# Patient Record
Sex: Male | Born: 1990 | Race: White | Hispanic: No | Marital: Single | Smoking: Current every day smoker
Health system: Southern US, Community
[De-identification: ages and names within clinical notes are randomized; demographics above are authoritative.]

---
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
## 2012-02-20 ENCOUNTER — Emergency Department: Payer: Self-pay | Admitting: Emergency Medicine

## 2012-10-03 ENCOUNTER — Emergency Department: Payer: Self-pay | Admitting: Emergency Medicine

## 2013-06-01 IMAGING — CT CT HEAD WITHOUT CONTRAST
2 series · 16 of 30 positions shown, 20 images · non-contrast
Comparison: none

REASON FOR EXAM: syncope with forehead trauma
COMMENTS:

PROCEDURE:     CT  - CT HEAD WITHOUT CONTRAST  - February 21, 2012  [DATE]
RESULT:     History: Syncope. Trauma.
Comparison Study: No recent.

[Series 2: without · axial · non-contrast · 0.41mm/px · z∈[-200,-80]mm · 13 of 30 slices shown, 17 images]
[im 3/30  brain]
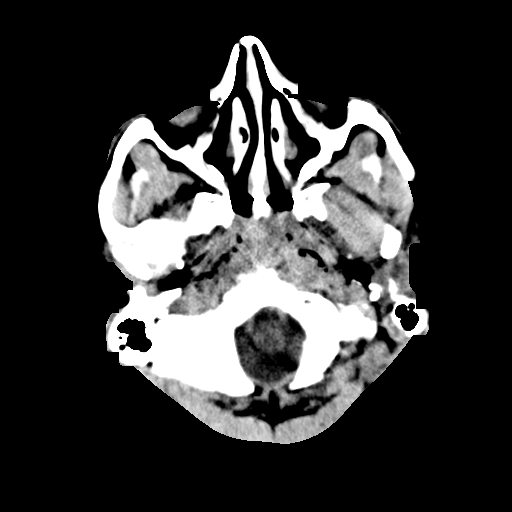
[im 3/30  bone]
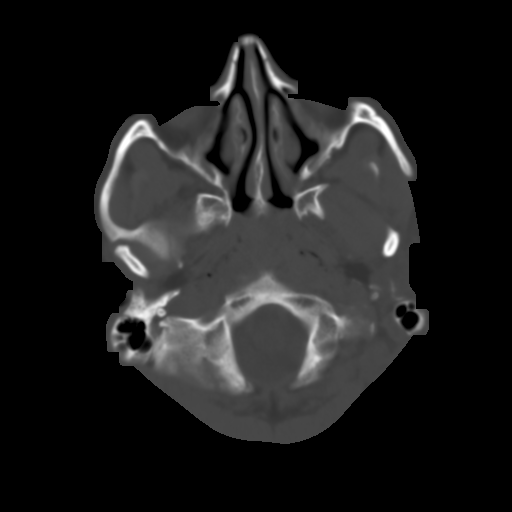
[im 5/30  brain]
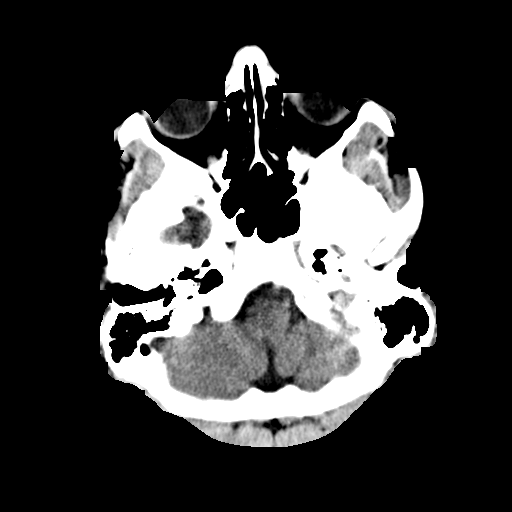
[im 7/30  brain]
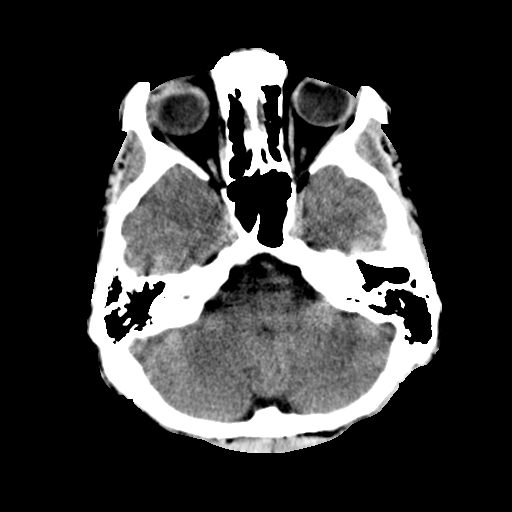
[im 9/30  brain]
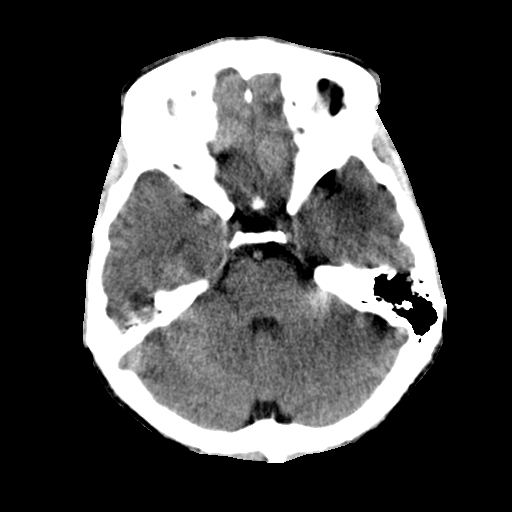
[im 11/30  brain]
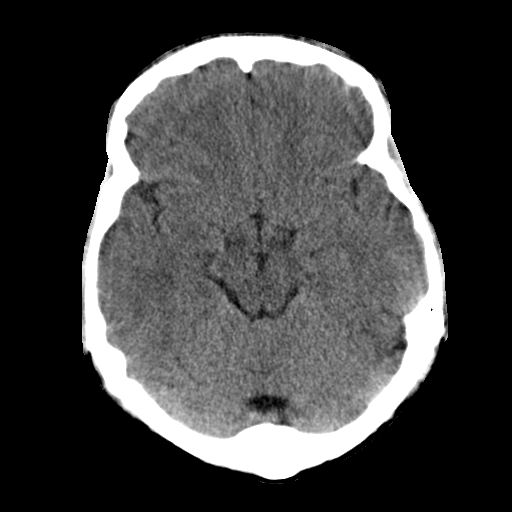
[im 11/30  bone]
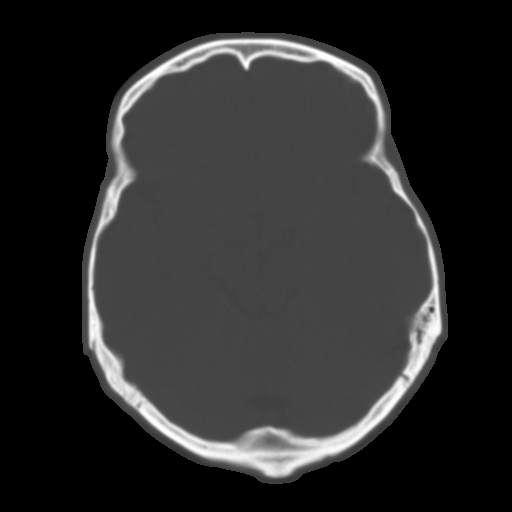
[im 13/30  brain]
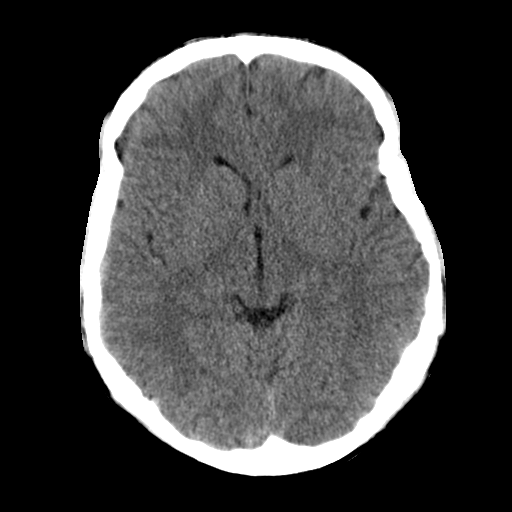
[im 15/30  brain]
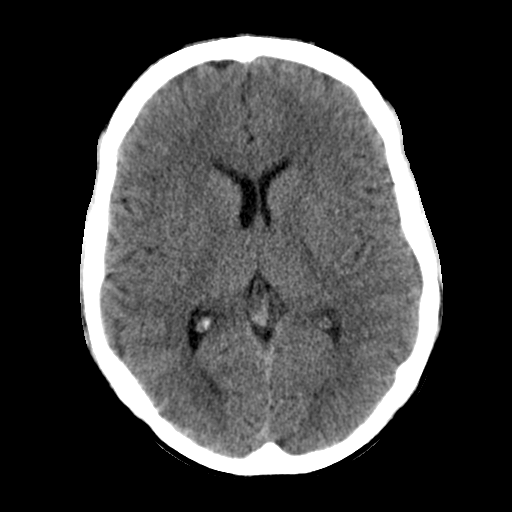
[im 17/30  brain]
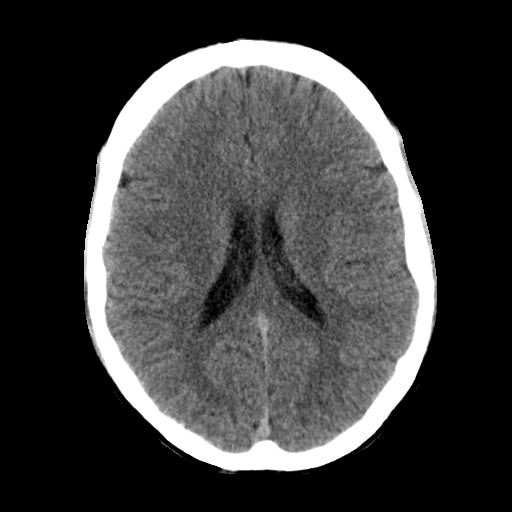
[im 19/30  brain]
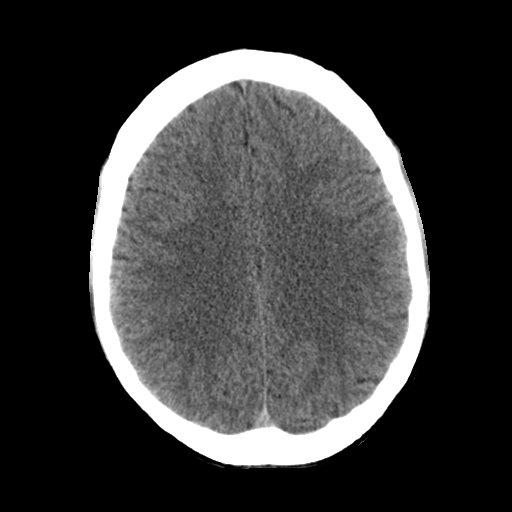
[im 19/30  bone]
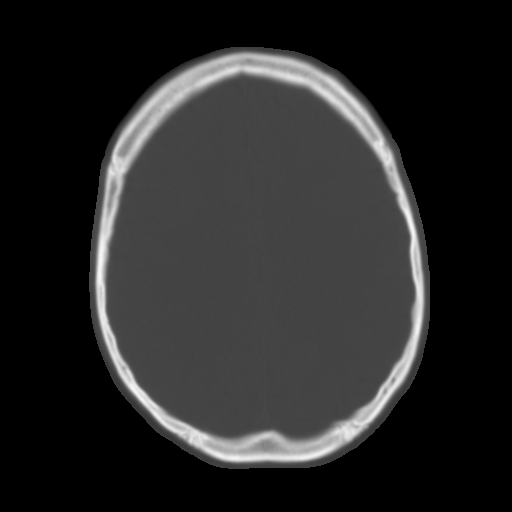
[im 21/30  brain]
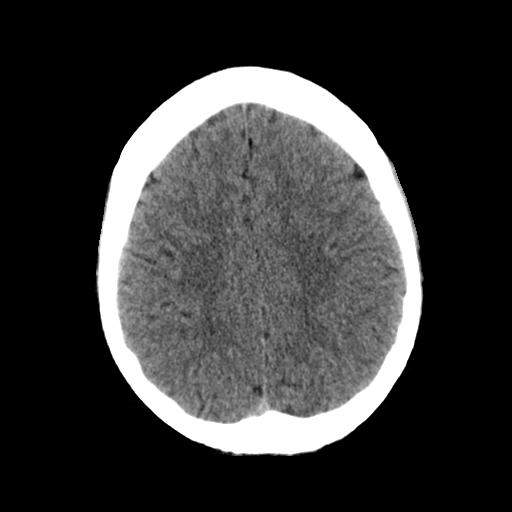
[im 23/30  brain]
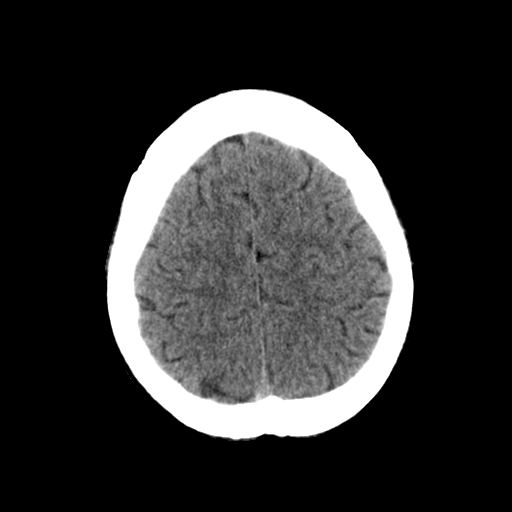
[im 25/30  brain]
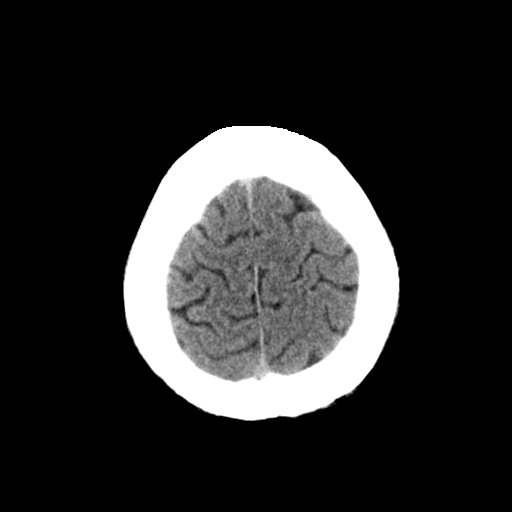
[im 27/30  brain]
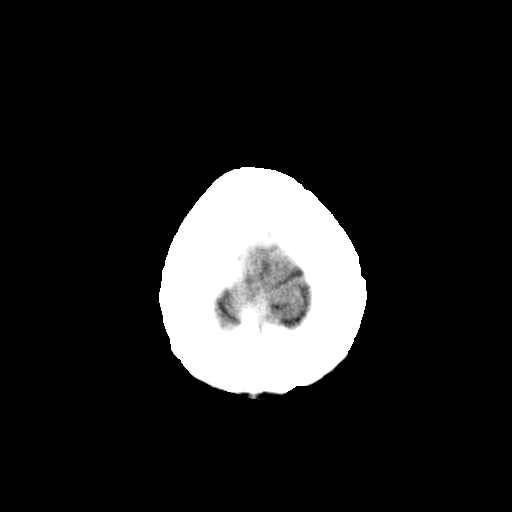
[im 27/30  bone]
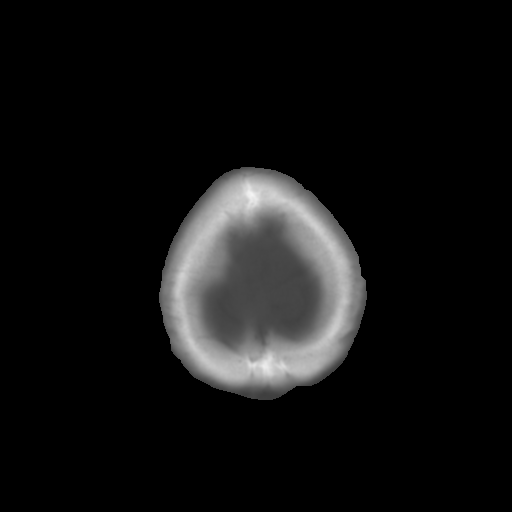

[Series 3: bone · axial · 0.41mm/px · z∈[-200,-160]mm · 3 of 30 slices shown]
[im 3/30  bone]
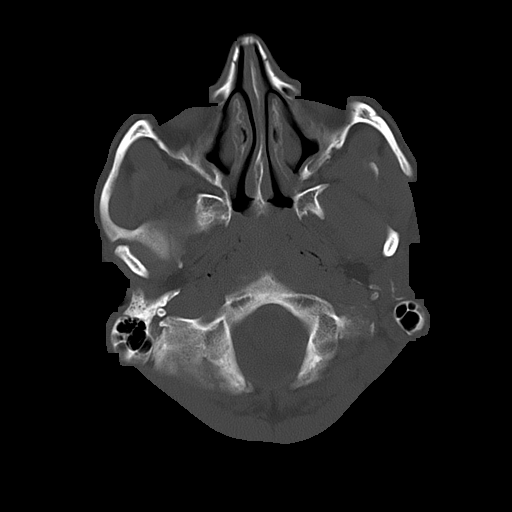
[im 7/30  bone]
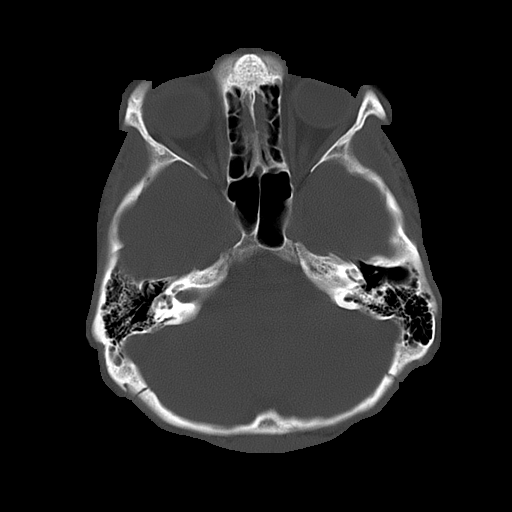
[im 11/30  bone]
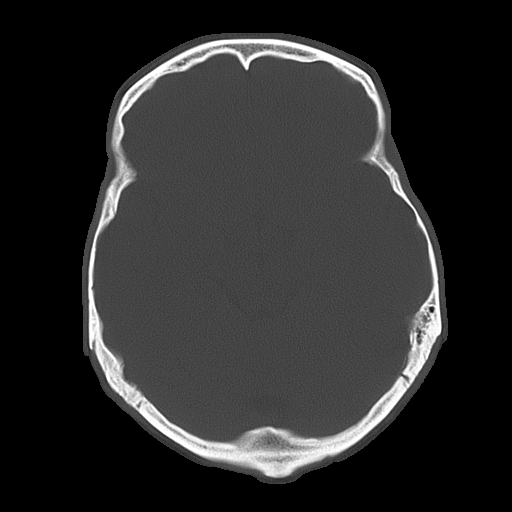

[16 of 30 positions shown; findings below may reference images not displayed]

FINDINGS: No mass. No hydrocephalus. No hemorrhage. No acute bony
abnormality. Mucosal thickening noted of the maxillary sinuses.
IMPRESSION: Mucosal thickening maxillary sinuses. No bony abnormality
demonstrated. No acute intracranial abnormality.

## 2014-01-12 IMAGING — CR DG CHEST 2V
1 series · 3 of 3 positions shown · non-contrast
Comparison: none

REASON FOR EXAM: cp worse with expiraton
COMMENTS:

PROCEDURE:     DXR - DXR CHEST PA (OR AP) AND LATERAL  - October 03, 2012 [DATE]
RESULT:     Comparison: None.

[Series 1: w chest pa · 0.14mm/px · 3 of 3 slices shown]
[im 1/3]
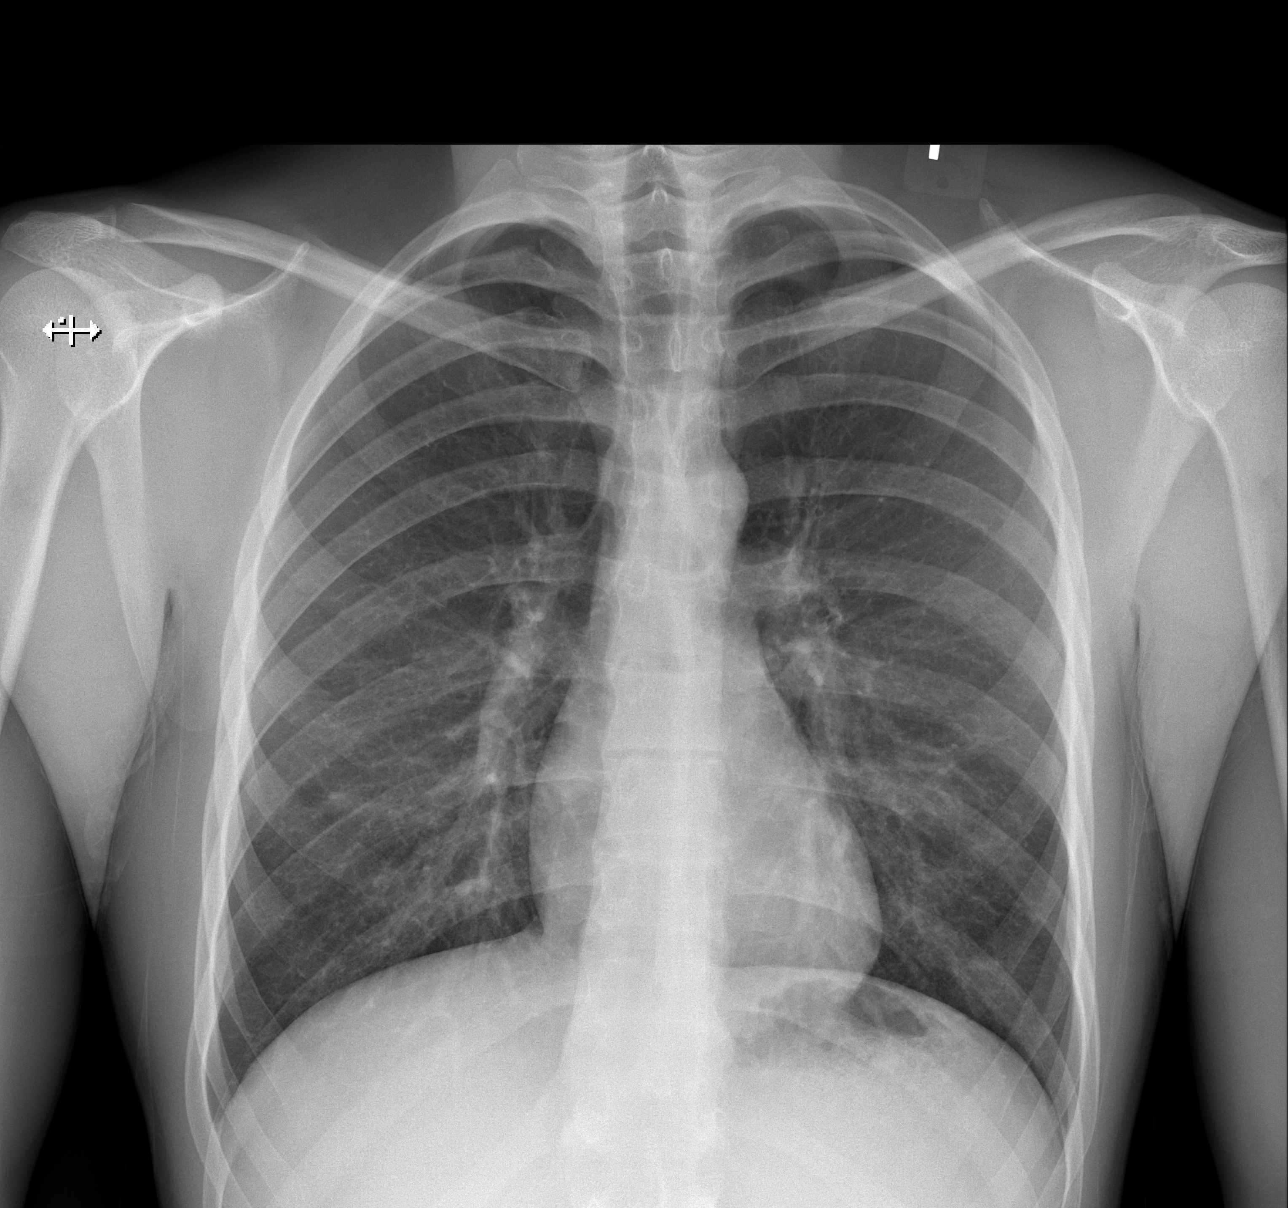
[im 2/3]
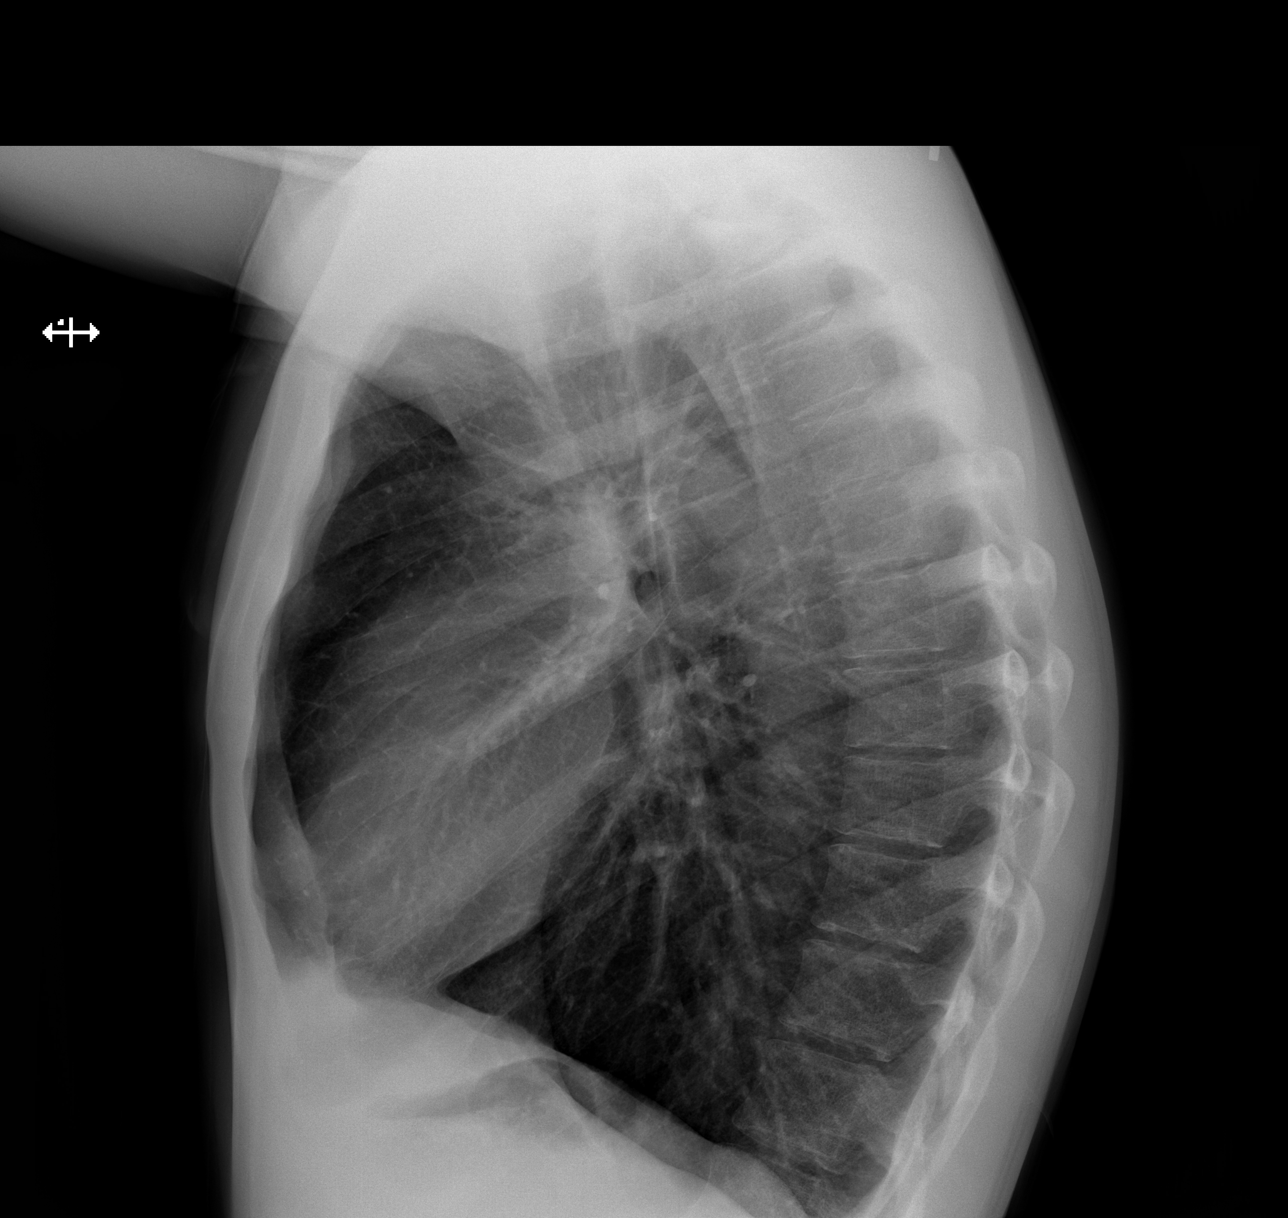
[im 3/3]
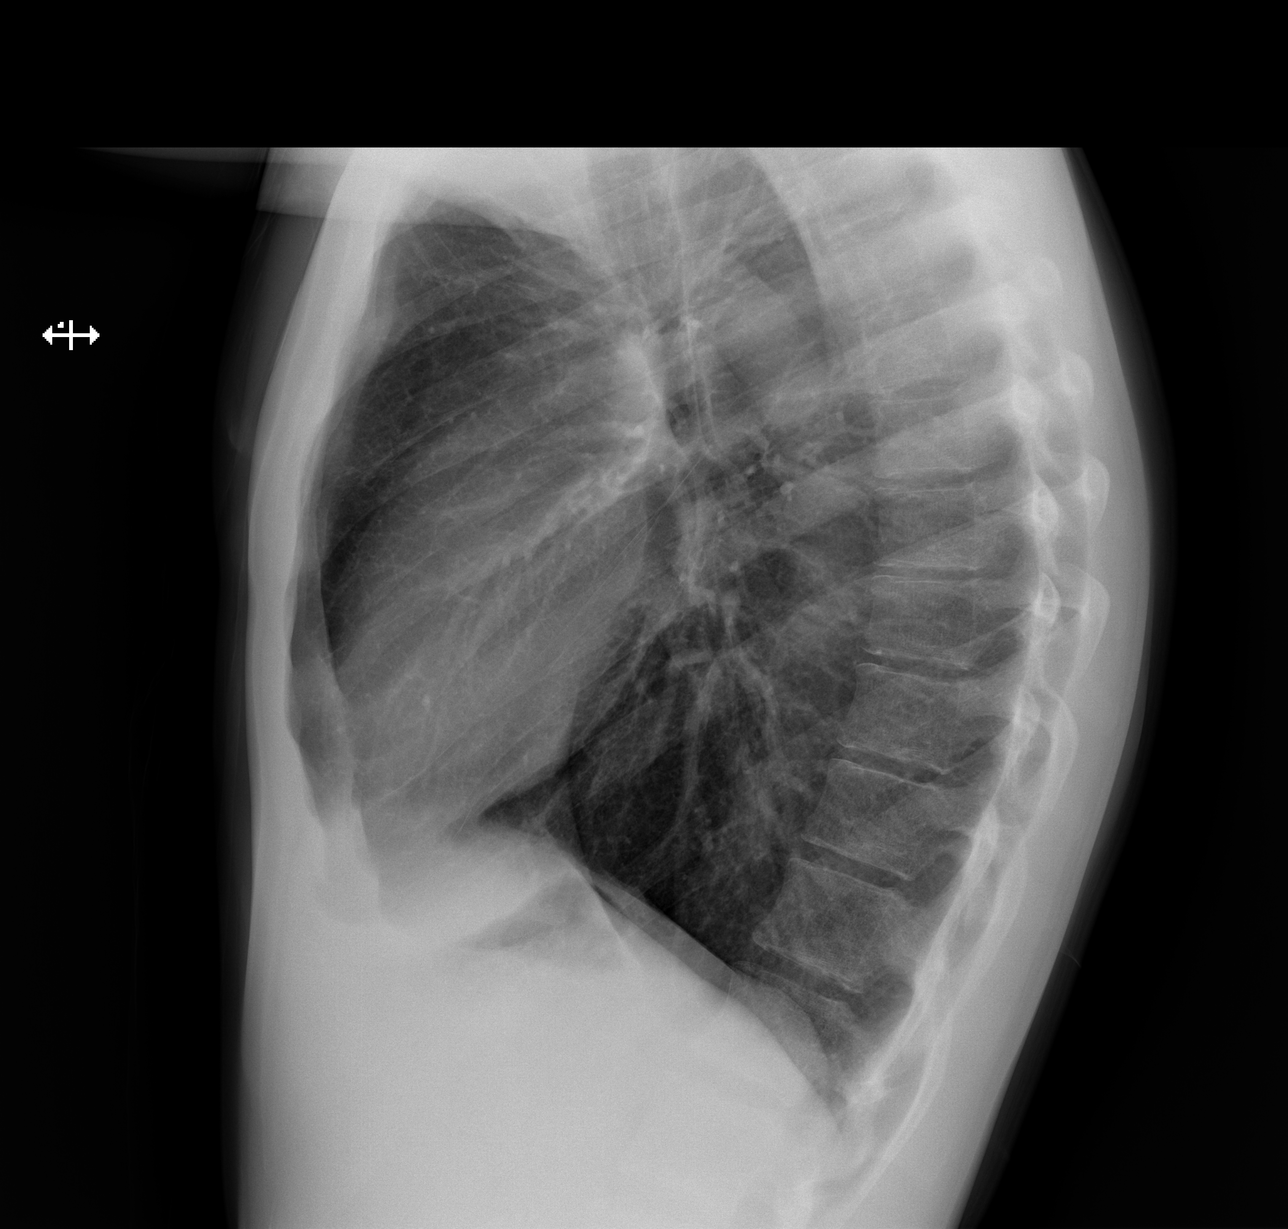

[3 of 3 positions shown; findings below may reference images not displayed]

FINDINGS: The heart and mediastinum are within normal limits. No focal pulmonary
opacities. The lungs are hyperinflated.
IMPRESSION: Hyperinflation. Otherwise, no acute cardiopulmonary disease.

[REDACTED]

## 2020-09-07 ENCOUNTER — Ambulatory Visit
Admission: EM | Admit: 2020-09-07 | Discharge: 2020-09-07 | Disposition: A | Discharge status: Home / Self Care | Payer: BC Managed Care – PPO | Attending: Sports Medicine | Admitting: Sports Medicine

## 2020-09-07 ENCOUNTER — Ambulatory Visit (INDEPENDENT_AMBULATORY_CARE_PROVIDER_SITE_OTHER): Payer: BC Managed Care – PPO

## 2020-09-07 ENCOUNTER — Other Ambulatory Visit: Payer: Self-pay

## 2020-09-07 DIAGNOSIS — R0989 Other specified symptoms and signs involving the circulatory and respiratory systems: Secondary | ICD-10-CM | POA: Diagnosis not present

## 2020-09-07 DIAGNOSIS — R0781 Pleurodynia: Secondary | ICD-10-CM

## 2020-09-07 DIAGNOSIS — R0789 Other chest pain: Secondary | ICD-10-CM | POA: Diagnosis not present

## 2020-09-07 DIAGNOSIS — U071 COVID-19: Secondary | ICD-10-CM | POA: Insufficient documentation

## 2020-09-07 DIAGNOSIS — I301 Infective pericarditis: Secondary | ICD-10-CM | POA: Diagnosis present

## 2020-09-07 LAB — CBC WITH DIFFERENTIAL/PLATELET
Abs Immature Granulocytes: 0.01 10*3/uL (ref 0.00–0.07)
Basophils Absolute: 0.1 10*3/uL (ref 0.0–0.1)
Basophils Relative: 1 %
Eosinophils Absolute: 0.1 10*3/uL (ref 0.0–0.5)
Eosinophils Relative: 2 %
HCT: 51.3 % (ref 39.0–52.0)
Hemoglobin: 17.4 g/dL — ABNORMAL HIGH (ref 13.0–17.0)
Immature Granulocytes: 0 %
Lymphocytes Relative: 36 %
Lymphs Abs: 2.3 10*3/uL (ref 0.7–4.0)
MCH: 29.8 pg (ref 26.0–34.0)
MCHC: 33.9 g/dL (ref 30.0–36.0)
MCV: 88 fL (ref 80.0–100.0)
Monocytes Absolute: 0.8 10*3/uL (ref 0.1–1.0)
Monocytes Relative: 12 %
Neutro Abs: 3.1 10*3/uL (ref 1.7–7.7)
Neutrophils Relative %: 49 %
Platelets: 363 10*3/uL (ref 150–400)
RBC: 5.83 MIL/uL — ABNORMAL HIGH (ref 4.22–5.81)
RDW: 12.8 % (ref 11.5–15.5)
WBC: 6.4 10*3/uL (ref 4.0–10.5)
nRBC: 0 % (ref 0.0–0.2)

## 2020-09-07 LAB — URINALYSIS, COMPLETE (UACMP) WITH MICROSCOPIC
Bacteria, UA: NONE SEEN
Bilirubin Urine: NEGATIVE
Glucose, UA: NEGATIVE mg/dL
Hgb urine dipstick: NEGATIVE
Ketones, ur: NEGATIVE mg/dL
Leukocytes,Ua: NEGATIVE
Nitrite: NEGATIVE
Protein, ur: NEGATIVE mg/dL
RBC / HPF: NONE SEEN RBC/hpf (ref 0–5)
Specific Gravity, Urine: 1.015 (ref 1.005–1.030)
pH: 6 (ref 5.0–8.0)

## 2020-09-07 LAB — SARS CORONAVIRUS 2 (TAT 6-24 HRS): SARS Coronavirus 2: POSITIVE — AB

## 2020-09-07 LAB — COMPREHENSIVE METABOLIC PANEL
ALT: 79 U/L — ABNORMAL HIGH (ref 0–44)
AST: 48 U/L — ABNORMAL HIGH (ref 15–41)
Albumin: 4.8 g/dL (ref 3.5–5.0)
Alkaline Phosphatase: 80 U/L (ref 38–126)
Anion gap: 4 — ABNORMAL LOW (ref 5–15)
BUN: 15 mg/dL (ref 6–20)
CO2: 33 mmol/L — ABNORMAL HIGH (ref 22–32)
Calcium: 9.7 mg/dL (ref 8.9–10.3)
Chloride: 101 mmol/L (ref 98–111)
Creatinine, Ser: 0.93 mg/dL (ref 0.61–1.24)
GFR, Estimated: >60 mL/min (ref >60)
Glucose, Bld: 96 mg/dL (ref 70–99)
Potassium: 4.6 mmol/L (ref 3.5–5.1)
Sodium: 138 mmol/L (ref 135–145)
Total Bilirubin: 0.8 mg/dL (ref 0.3–1.2)
Total Protein: 8.5 g/dL — ABNORMAL HIGH (ref 6.5–8.1)

## 2020-09-07 LAB — TROPONIN I (HIGH SENSITIVITY): Troponin I (High Sensitivity): 3 ng/L (ref <18)

## 2020-09-07 MED ORDER — IBUPROFEN 600 MG PO TABS: 600.0000 mg | ORAL_TABLET | Freq: Four times a day (QID) | ORAL | 0 refills | Status: AC | PRN

## 2020-09-07 NOTE — ED Provider Notes (Signed)
[facility]    CSN: [id number] Arrival date & time: 09/07/20  0813      History   Chief Complaint Chief Complaint  Patient presents with  . Cough    HPI Calvin Scott is a 30 y.o. male.   Pleasant 30 year old male who presents for evaluation of the above issues.  He says he has had congestion and some chest tightness and chest pain that radiates into his back for about 5 days now.  He denies any jaw pain or arm pain on my independent history.  He was concerned enough that he stop smoking when his symptoms began.  He was smoking a pack a day.  He admits to recreational marijuana use but no other illicit drugs.  He also stopped energy drinks which she was taking twice a day.  He has not sought out any medical care for this.  He has no vaccinations to COVID or influenza.  He does have a remote history of asthma but does not take an inhaler.  He works as a Tax inspector and it appears as though he does not get regular medical care.     History reviewed. No pertinent past medical history.  There are no problems to display for this patient.   History reviewed. No pertinent surgical history.     Home Medications    Prior to Admission medications   Not on File    Family History History reviewed. No pertinent family history.  Social History Social History   Tobacco Use  . Smoking status: Current Every Day Smoker    Packs/day: 1.00    Types: Cigarettes  . Smokeless tobacco: Never Used  Vaping Use  . Vaping Use: Never used  Substance Use Topics  . Alcohol use: Yes    Comment: rare  . Drug use: Yes    Types: Marijuana    Comment: last use 3 days ago     Allergies   Patient has no known allergies.   Review of Systems Review of Systems  Constitutional: Negative for activity change, chills, fatigue and fever.  HENT: Positive for congestion. Negative for ear discharge, ear pain, rhinorrhea, sinus pressure, sinus pain, sneezing and sore throat.    Eyes: Negative for pain.  Respiratory: Positive for cough and chest tightness. Negative for apnea, shortness of breath, wheezing and stridor.   Cardiovascular: Positive for chest pain. Negative for palpitations.  Gastrointestinal: Negative for abdominal pain.  Genitourinary: Negative for dysuria.  Musculoskeletal: Positive for back pain.  Skin: Negative for color change, pallor, rash and wound.  Neurological: Negative for dizziness, tremors, seizures, syncope, facial asymmetry, speech difficulty, weakness, light-headedness, numbness and headaches.  All other systems reviewed and are negative.    Physical Exam Triage Vital Signs ED Triage Vitals  Enc Vitals Group     BP 09/07/20 0930 (!) 138/98     Pulse Rate 09/07/20 0930 65     Resp 09/07/20 0930 18     Temp 09/07/20 0930 98.4 F (36.9 C)     Temp Source 09/07/20 0930 Oral     SpO2 09/07/20 0930 100 %     Weight 09/07/20 0932 130 lb (59 kg)     Height 09/07/20 0932 '5\' 6"'  (1.676 m)     Head Circumference --      Peak Flow --      Pain Score 09/07/20 0931 3     Pain Loc --      Pain Edu? --  Excl. in GC? --    No data found.  Updated Vital Signs BP (!) 138/98 (BP Location: Left Arm)   Pulse 65   Temp 98.4 F (36.9 C) (Oral)   Resp 18   Ht '5\' 6"'  (1.676 m)   Wt 59 kg   SpO2 100%   BMI 20.98 kg/m   Visual Acuity Right Eye Distance:   Left Eye Distance:   Bilateral Distance:    Right Eye Near:   Left Eye Near:    Bilateral Near:     Physical Exam Vitals and nursing note reviewed.  Constitutional:      General: He is not in acute distress.    Appearance: Normal appearance. He is not ill-appearing or toxic-appearing.  HENT:     Head: Normocephalic and atraumatic.     Right Ear: Tympanic membrane normal.     Left Ear: Tympanic membrane normal.     Nose: Congestion present. No rhinorrhea.     Mouth/Throat:     Mouth: Mucous membranes are moist.     Pharynx: Posterior oropharyngeal erythema present. No  oropharyngeal exudate.  Eyes:     Conjunctiva/sclera: Conjunctivae normal.     Pupils: Pupils are equal, round, and reactive to light.  Neck:     Vascular: No carotid bruit.  Cardiovascular:     Rate and Rhythm: Normal rate and regular rhythm.     Pulses: Normal pulses.     Heart sounds: Normal heart sounds. No murmur heard. No friction rub. No gallop.      Comments: On my independent exam his pulse rate was in the 60s.  Regular rate and rhythm.  EKG as listed under EKG. Pulmonary:     Effort: Pulmonary effort is normal. No respiratory distress.     Breath sounds: Normal breath sounds. No stridor. No wheezing, rhonchi or rales.  Musculoskeletal:     Cervical back: Normal range of motion and neck supple. No rigidity or tenderness.  Lymphadenopathy:     Cervical: Cervical adenopathy present.  Skin:    General: Skin is warm and dry.     Capillary Refill: Capillary refill takes less than 2 seconds.  Neurological:     General: No focal deficit present.     Mental Status: He is alert and oriented to person, place, and time.  Psychiatric:        Mood and Affect: Mood normal.        Behavior: Behavior normal.      UC Treatments / Results  Labs (all labs ordered are listed, but only abnormal results are displayed) Labs Reviewed  CBC WITH DIFFERENTIAL/PLATELET - Abnormal; Notable for the following components:      Result Value   RBC 5.83 (*)    Hemoglobin 17.4 (*)    All other components within normal limits  URINALYSIS, COMPLETE (UACMP) WITH MICROSCOPIC - Abnormal; Notable for the following components:   Color, Urine STRAW (*)    All other components within normal limits  COMPREHENSIVE METABOLIC PANEL - Abnormal; Notable for the following components:   CO2 33 (*)    Total Protein 8.5 (*)    AST 48 (*)    ALT 79 (*)    Anion gap 4 (*)    All other components within normal limits  SARS CORONAVIRUS 2 (TAT 6-24 HRS)  TROPONIN I (HIGH SENSITIVITY)  TROPONIN I (HIGH SENSITIVITY)     EKG  Twelve-lead EKG done in the office today at 9:37 AM shows a ventricular rate  of 49 bpm.  My independent exam had the patient heart rate in the 60s.  PR interval was 138 QRS duration 80 and QT and QTc was 398 and 359.  There was some concern about pericarditis versus possible acute coronary syndrome.  Radiology DG Chest 2 View  Result Date: 09/07/2020 CLINICAL DATA:  Chest tightness, congestion EXAM: CHEST - 2 VIEW COMPARISON:  10/03/2012 FINDINGS: The heart size and mediastinal contours are within normal limits. Hyperinflated lungs. Both lungs are clear. The visualized skeletal structures are unremarkable. IMPRESSION: 1. No acute cardiopulmonary process. 2. Hyperinflated lungs, which may reflect underlying asthma. Electronically Signed   By: Davina Poke D.O.   On: 09/07/2020 10:21    Procedures Procedures (including critical care time)  Medications Ordered in UC Medications - No data to display  Initial Impression / Assessment and Plan / UC Course  I have reviewed the triage vital signs and the nursing notes.  Pertinent labs & imaging results that were available during my care of the patient were reviewed by me and considered in my medical decision making (see chart for details).  Clinical impression: 30 year old with chest pain radiating to the back.  May just be cardiac related COVID.  Troponin was within normal limits.  EKG did show some abnormalities consistent with probable pericarditis.  Treatment plan: 1.  The findings and treatment plan were discussed in detail with the patient.  Patient was in agreement. 2.  Recommended getting some labs.  Get a CBC c-Met and a troponin.  Also will obtain a chest x-ray and a UA.  Results are above.  Troponin level was 3 so not consistent with acute coronary syndrome. 3.  Will make a referral to cardiology.  We will try to get him into see Dr. Serafina Royals at [facility] here in [place]. 4.  In the meantime, if his chest pain  was to worsen or he was getting any shortness of breath or any other symptoms of an MI he should call 911 and go straight to the emergency room.  He voiced verbal understanding. 5.  His LFTs were elevated and I have encouraged him to reduce or stop drinking.  He has stopped his energy drinks and his cigarettes which is a good first start.  He should also curtail the recreational marijuana use. 6.  His hemoglobin was elevated probably from his smoking history.  He is a thin individual so I doubt that he has obstructive sleep apnea. 7.  We will give him a work note as he cannot return to work until he has a documented negative COVID test.  That was also drawn today.  If he is positive someone from our office will call him.  If he is negative then he can return to work on January 13.  He should log into MyChart and monitor his test result. 8.  We will discharge her from care.  Appreciate cardiology's input on this.     Final Clinical Impressions(s) / UC Diagnoses   Final diagnoses:  Acute viral pericarditis  Pleurodynia     Discharge Instructions     Recommended getting some labs.  Get a CBC c-Met and a troponin.  Also will obtain a chest x-ray and a UA.  Results are above.  Troponin level was 3 so not consistent with acute coronary syndrome. Will make a referral to cardiology.  We will try to get him into see Dr. Serafina Royals at [facility] clinic here in [place]. In the meantime, if his  chest pain was to worsen or he was getting any shortness of breath or any other symptoms of an MI he should call 911 and go straight to the emergency room.  He voiced verbal understanding. His LFTs were elevated and I have encouraged him to reduce or stop drinking.  He has stopped his energy drinks and his cigarettes which is a good first start.  He should also curtail the recreational marijuana use. His hemoglobin was elevated probably from his smoking history.  He is a thin individual so I doubt that he has  obstructive sleep apnea. We will give him a work note as he cannot return to work until he has a documented negative COVID test.  That was also drawn today.  If he is positive someone from our office will call him.  If he is negative then he can return to work on January 13.  He should log into MyChart and monitor his test result. We will discharge her from care.  Appreciate cardiology's input on this    ED Prescriptions    None     PDMP not reviewed this encounter.   Verda Cumins, MD 09/07/20 1216

## 2020-09-07 NOTE — Discharge Instructions (Addendum)
Recommended getting some labs.  Get a CBC c-Met and a troponin.  Also will obtain a chest x-ray and a UA.  Results are above.  Troponin level was 3 so not consistent with acute coronary syndrome. Will make a referral to cardiology.  We will try to get him into see Dr. Serafina Royals at [facility] clinic here in [place]. In the meantime, if his chest pain was to worsen or he was getting any shortness of breath or any other symptoms of an MI he should call 911 and go straight to the emergency room.  He voiced verbal understanding. His LFTs were elevated and I have encouraged him to reduce or stop drinking.  He has stopped his energy drinks and his cigarettes which is a good first start.  He should also curtail the recreational marijuana use. His hemoglobin was elevated probably from his smoking history.  He is a thin individual so I doubt that he has obstructive sleep apnea. We will give him a work note as he cannot return to work until he has a documented negative COVID test.  That was also drawn today.  If he is positive someone from our office will call him.  If he is negative then he can return to work on January 13.  He should log into MyChart and monitor his test result. We will discharge her from care.  Appreciate cardiology's input on this

## 2020-09-07 NOTE — ED Triage Notes (Addendum)
Pt states nasal congestion a week ago. Now chest is tight and although he doesn't cough voluntarily when he coughs he produces mucous. States he has had bodyaches but mostly in the left arm. Pt states he does smoke and also drinks a lot of energy drinks so is concerned about his heart

## 2020-09-08 ENCOUNTER — Telehealth: Payer: Self-pay | Admitting: Emergency Medicine

## 2020-09-08 NOTE — Telephone Encounter (Signed)
Patient called today returning Dr. Zachery Dauer call from earlier this morning. Returned patient's call. Patient states that he wasn't able to be seen at [facility] today as scheduled for pericarditis due to +covid. Patient rescheduled cardiology appointment for 09/28/20.   Called [facility] and spoke with Carollee Herter, RN and she advised that they would not see any +covid patients until after their quarantine period is over.   Called patient to advise of ED precautions. Patient states that he isn't having any worsening CP today. Patient states his symptoms are about the same while taking Ibuprofen 600mg  every 6 hours. I advised if symptoms get worse to go directly to the ED for evaluation. Patient agreed and voiced understanding.

## 2023-04-03 ENCOUNTER — Ambulatory Visit: Payer: Self-pay | Admitting: Physician Assistant
